# Patient Record
Sex: Female | Born: 1975 | Hispanic: No | Marital: Single | Smoking: Former smoker
Health system: Southern US, Community
[De-identification: ages and names within clinical notes are randomized; demographics above are authoritative.]

---

## 2015-10-09 ENCOUNTER — Emergency Department (HOSPITAL_BASED_OUTPATIENT_CLINIC_OR_DEPARTMENT_OTHER)
Admission: EM | Admit: 2015-10-09 | Discharge: 2015-10-09 | Disposition: A | Discharge status: Home / Self Care | Payer: BLUE CROSS/BLUE SHIELD | Attending: Emergency Medicine | Admitting: Emergency Medicine

## 2015-10-09 ENCOUNTER — Emergency Department (HOSPITAL_BASED_OUTPATIENT_CLINIC_OR_DEPARTMENT_OTHER): Payer: BLUE CROSS/BLUE SHIELD

## 2015-10-09 ENCOUNTER — Encounter (HOSPITAL_BASED_OUTPATIENT_CLINIC_OR_DEPARTMENT_OTHER): Payer: Self-pay | Admitting: Emergency Medicine

## 2015-10-09 DIAGNOSIS — R0789 Other chest pain: Secondary | ICD-10-CM | POA: Diagnosis not present

## 2015-10-09 DIAGNOSIS — Z87891 Personal history of nicotine dependence: Secondary | ICD-10-CM | POA: Insufficient documentation

## 2015-10-09 LAB — CBC WITH DIFFERENTIAL/PLATELET
BASOS PCT: 0 %
Basophils Absolute: 0 10*3/uL (ref 0.0–0.1)
EOS ABS: 0.3 10*3/uL (ref 0.0–0.7)
Eosinophils Relative: 4 %
HCT: 35.9 % — ABNORMAL LOW (ref 36.0–46.0)
HEMOGLOBIN: 12.1 g/dL (ref 12.0–15.0)
Lymphocytes Relative: 33 %
Lymphs Abs: 2.3 10*3/uL (ref 0.7–4.0)
MCH: 32.4 pg (ref 26.0–34.0)
MCHC: 33.7 g/dL (ref 30.0–36.0)
MCV: 96.2 fL (ref 78.0–100.0)
Monocytes Absolute: 0.6 10*3/uL (ref 0.1–1.0)
Monocytes Relative: 9 %
NEUTROS PCT: 54 %
Neutro Abs: 3.8 10*3/uL (ref 1.7–7.7)
Platelets: 190 10*3/uL (ref 150–400)
RBC: 3.73 MIL/uL — AB (ref 3.87–5.11)
RDW: 12.6 % (ref 11.5–15.5)
WBC: 7 10*3/uL (ref 4.0–10.5)

## 2015-10-09 LAB — TROPONIN I: Troponin I: <0.03 ng/mL (ref <0.03)

## 2015-10-09 LAB — BASIC METABOLIC PANEL
Anion gap: 7 (ref 5–15)
BUN: 18 mg/dL (ref 6–20)
CALCIUM: 9.9 mg/dL (ref 8.9–10.3)
CO2: 28 mmol/L (ref 22–32)
CREATININE: 0.67 mg/dL (ref 0.44–1.00)
Chloride: 103 mmol/L (ref 101–111)
GFR calc non Af Amer: >60 mL/min (ref >60)
Glucose, Bld: 104 mg/dL — ABNORMAL HIGH (ref 65–99)
Potassium: 4.2 mmol/L (ref 3.5–5.1)
SODIUM: 138 mmol/L (ref 135–145)

## 2015-10-09 MED ORDER — ACETAMINOPHEN 500 MG PO TABS
ORAL_TABLET | ORAL | Status: AC
  Filled 2015-10-09: qty 2

## 2015-10-09 MED ORDER — GI COCKTAIL ~~LOC~~
30.0000 mL | Freq: Once | ORAL | Status: AC
  Administered 2015-10-09: 30 mL via ORAL
  Filled 2015-10-09: qty 30

## 2015-10-09 MED ORDER — ACETAMINOPHEN 500 MG PO TABS
1000.0000 mg | ORAL_TABLET | Freq: Once | ORAL | Status: AC
  Administered 2015-10-09: 1000 mg via ORAL
  Filled 2015-10-09: qty 2

## 2015-10-09 MED ORDER — ACETAMINOPHEN 500 MG PO TABS
1000.0000 mg | ORAL_TABLET | Freq: Once | ORAL | Status: AC
  Administered 2015-10-09: 1000 mg via ORAL

## 2015-10-09 NOTE — ED Notes (Signed)
Pt in c/o L sided chest pain that radiates to L arm and neck that started last week. States previously happened and was told she has lung nodules. Pt with limited English but able to communicate basics, friend at bedside. Ambulatory in NAD.

## 2015-10-09 NOTE — ED Provider Notes (Addendum)
CSN: [id number]     Arrival date & time 10/09/15  1606 History   First MD Initiated Contact with Patient 10/09/15 1617     Chief Complaint  Patient presents with  . Chest Pain   Patient speaks minimally was. History is obtained using professional medical interpreter  (Consider location/radiation/quality/duration/timing/severity/associated sxs/prior Treatment) HPI [place] of left anterior chest pain radiating to left arm and neck onset 1 week ago intermittent lasting anywhere from from 10 minutes to one hour. Pain is improved with walking. No shortness of breath. She had similar pain earlier this year. Was seen at a hospital [place] was told that she had lung nodules and referred to a specialist. She is supposed to go for repeat CT scan of chest within the next few month. She denies any nausea or sweatiness. No treatment prior to coming here. Last normal menstrual period 2 weeks ago. No other associated symptoms. History reviewed. No pertinent past medical history. Past medical history lung nodules History reviewed. No pertinent past surgical history. History reviewed. No pertinent family history. Social History  Substance Use Topics  . Smoking status: Former Games developer  . Smokeless tobacco: None  . Alcohol Use: No  Ex smoker quit 3 months ago no alcohol no drugs OB History    No data available     Review of Systems  Constitutional: Negative.   HENT: Negative.   Respiratory: Negative.   Cardiovascular: Positive for chest pain.  Gastrointestinal: Negative.   Musculoskeletal: Negative.   Skin: Negative.   Neurological: Negative.   Psychiatric/Behavioral: Negative.   All other systems reviewed and are negative.     Allergies  Review of patient's allergies indicates not on file.  Home Medications   Prior to Admission medications   Not on File   BP 121/81 mmHg  Pulse 54  Temp(Src) 98 F (36.7 C)  Resp 18  SpO2 98% Physical Exam  Constitutional: She appears well-developed  and well-nourished.  HENT:  Head: Normocephalic and atraumatic.  Eyes: Conjunctivae are normal. Pupils are equal, round, and reactive to light.  Neck: Neck supple. No tracheal deviation present. No thyromegaly present.  Cardiovascular: Normal rate and regular rhythm.   No murmur heard. Pulmonary/Chest: Effort normal and breath sounds normal.  Abdominal: Soft. Bowel sounds are normal. She exhibits no distension. There is tenderness.  Of chest mildly tender. Pain is exacerbated when she sits up from a seated position  Musculoskeletal: Normal range of motion. She exhibits no edema or tenderness.  Neurological: She is alert. Coordination normal.  Skin: Skin is warm and dry. No rash noted.  Psychiatric: She has a normal mood and affect.  Nursing note and vitals reviewed.   ED Course  Procedures (including critical care time) Labs Review Labs Reviewed - No data to display  Imaging Review No results found. I have personally reviewed and evaluated these images and lab results as part of my medical decision-making.   EKG Interpretation   Date/Time:  Thursday October 09 2015 16:14:44 EDT Ventricular Rate:  53 PR Interval:    QRS Duration: 84 QT Interval:  421 QTC Calculation: 396 R Axis:   73 Text Interpretation:  Sinus rhythm No old tracing to compare Confirmed by  Breyton Vanscyoc  MD, Faye Strohman [phone]) on 10/09/2015 4:18:08 PM     Chest x-ray viewed by me Administered Tylenol 4:40 PM with transient relief. At 8:10 PM pain recurred after eating. She was given GI cocktail without relief. She states Tylenol helps better. Tylenol ordered again at 9:10 PM  Results for orders placed or performed during the hospital encounter of 10/09/15  Troponin I  Result Value Ref Range   Troponin I <0.03 <0.03 ng/mL  Troponin I  Result Value Ref Range   Troponin I <0.03 <0.03 ng/mL  Basic metabolic panel  Result Value Ref Range   Sodium 138 135 - 145 mmol/L   Potassium 4.2 3.5 - 5.1 mmol/L   Chloride 103  101 - 111 mmol/L   CO2 28 22 - 32 mmol/L   Glucose, Bld 104 (H) 65 - 99 mg/dL   BUN 18 6 - 20 mg/dL   Creatinine, Ser 0.450.67 0.44 - 1.00 mg/dL   Calcium 9.9 8.9 - 40.910.3 mg/dL   GFR calc non Af Amer >60 >60 mL/min   GFR calc Af Amer >60 >60 mL/min   Anion gap 7 5 - 15  CBC with Differential/Platelet  Result Value Ref Range   WBC 7.0 4.0 - 10.5 K/uL   RBC 3.73 (L) 3.87 - 5.11 MIL/uL   Hemoglobin 12.1 12.0 - 15.0 g/dL   HCT 81.135.9 (L) 91.436.0 - 78.246.0 %   MCV 96.2 78.0 - 100.0 fL   MCH 32.4 26.0 - 34.0 pg   MCHC 33.7 30.0 - 36.0 g/dL   RDW 95.612.6 21.311.5 - 08.615.5 %   Platelets 190 150 - 400 K/uL   Neutrophils Relative % 54 %   Neutro Abs 3.8 1.7 - 7.7 K/uL   Lymphocytes Relative 33 %   Lymphs Abs 2.3 0.7 - 4.0 K/uL   Monocytes Relative 9 %   Monocytes Absolute 0.6 0.1 - 1.0 K/uL   Eosinophils Relative 4 %   Eosinophils Absolute 0.3 0.0 - 0.7 K/uL   Basophils Relative 0 %   Basophils Absolute 0.0 0.0 - 0.1 K/uL   Dg Chest 2 View  10/09/2015  CLINICAL DATA:  Acute left side chest pain for 1 week EXAM: CHEST  2 VIEW COMPARISON:  None. FINDINGS: The heart size and mediastinal contours are within normal limits. Both lungs are clear. The visualized skeletal structures are unremarkable. IMPRESSION: No active cardiopulmonary disease. Electronically Signed   By: Natasha MeadLiviu  Pop M.D.   On: 10/09/2015 17:19    MDM  Patient is felt to be nonspecific. [facility] 04/21/2015 which showed tiny lung nodules largest being 4 mm if patient at low risk no follow-up needed if at high risk for bronchogenic carcinoma, repeat CT scan recommended for 1 year. Pain is felt to be nonspecific. Heart score equals 1. Plan Tylenol for pain. Referral [place] [facility] Diagnoses atypical chest pain Final diagnoses:  None        Doug SouSam Nnamdi Dacus, MD 10/09/15 2118  Doug SouSam Marcanthony Sleight, MD 10/09/15 2120

## 2015-10-09 NOTE — Discharge Instructions (Signed)
Nonspecific Chest Pain  Call [place] [facility] to get a primary care physician, or see your local community clinic. Take Tylenol as directed for pain. Chest pain can be caused by many different conditions. There is always a chance that your pain could be related to something serious, such as a heart attack or a blood clot in your lungs. Chest pain can also be caused by conditions that are not life-threatening. If you have chest pain, it is very important to follow up with your health care provider. CAUSES  Chest pain can be caused by:  Heartburn.  Pneumonia or bronchitis.  Anxiety or stress.  Inflammation around your heart (pericarditis) or lung (pleuritis or pleurisy).  A blood clot in your lung.  A collapsed lung (pneumothorax). It can develop suddenly on its own (spontaneous pneumothorax) or from trauma to the chest.  Shingles infection (varicella-zoster virus).  Heart attack.  Damage to the bones, muscles, and cartilage that make up your chest wall. This can include:  Bruised bones due to injury.  Strained muscles or cartilage due to frequent or repeated coughing or overwork.  Fracture to one or more ribs.  Sore cartilage due to inflammation (costochondritis). RISK FACTORS  Risk factors for chest pain may include:  Activities that increase your risk for trauma or injury to your chest.  Respiratory infections or conditions that cause frequent coughing.  Medical conditions or overeating that can cause heartburn.  Heart disease or family history of heart disease.  Conditions or health behaviors that increase your risk of developing a blood clot.  Having had chicken pox (varicella zoster). SIGNS AND SYMPTOMS Chest pain can feel like:  Burning or tingling on the surface of your chest or deep in your chest.  Crushing, pressure, aching, or squeezing pain.  Dull or sharp pain that is worse when you move, cough, or take a deep breath.  Pain  that is also felt in your back, neck, shoulder, or arm, or pain that spreads to any of these areas. Your chest pain may come and go, or it may stay constant. DIAGNOSIS Lab tests or other studies may be needed to find the cause of your pain. Your health care provider may have you take a test called an ambulatory ECG (electrocardiogram). An ECG records your heartbeat patterns at the time the test is performed. You may also have other tests, such as:  Transthoracic echocardiogram (TTE). During echocardiography, sound waves are used to create a picture of all of the heart structures and to look at how blood flows through your heart.  Transesophageal echocardiogram (TEE).This is a more advanced imaging test that obtains images from inside your body. It allows your health care provider to see your heart in finer detail.  Cardiac monitoring. This allows your health care provider to monitor your heart rate and rhythm in real time.  Holter monitor. This is a portable device that records your heartbeat and can help to diagnose abnormal heartbeats. It allows your health care provider to track your heart activity for several days, if needed.  Stress tests. These can be done through exercise or by taking medicine that makes your heart beat more quickly.  Blood tests.  Imaging tests. TREATMENT  Your treatment depends on what is causing your chest pain. Treatment may include:  Medicines. These may include:  Acid blockers for heartburn.  Anti-inflammatory medicine.  Pain medicine for inflammatory conditions.  Antibiotic medicine, if an infection is present.  Medicines to dissolve blood clots.  Medicines to treat coronary artery disease.  Supportive care for conditions that do not require medicines. This may include:  Resting.  Applying heat or cold packs to injured areas.  Limiting activities until pain decreases. HOME CARE INSTRUCTIONS  If you were prescribed an antibiotic medicine,  finish it all even if you start to feel better.  Avoid any activities that bring on chest pain.  Do not use any tobacco products, including cigarettes, chewing tobacco, or electronic cigarettes. If you need help quitting, ask your health care provider.  Do not drink alcohol.  Take medicines only as directed by your health care provider.  Keep all follow-up visits as directed by your health care provider. This is important. This includes any further testing if your chest pain does not go away.  If heartburn is the cause for your chest pain, you may be told to keep your head raised (elevated) while sleeping. This reduces the chance that acid will go from your stomach into your esophagus.  Make lifestyle changes as directed by your health care provider. These may include:  Getting regular exercise. Ask your health care provider to suggest some activities that are safe for you.  Eating a heart-healthy diet. A registered dietitian can help you to learn healthy eating options.  Maintaining a healthy weight.  Managing diabetes, if necessary.  Reducing stress. SEEK MEDICAL CARE IF:  Your chest pain does not go away after treatment.  You have a rash with blisters on your chest.  You have a fever. SEEK IMMEDIATE MEDICAL CARE IF:   Your chest pain is worse.  You have an increasing cough, or you cough up blood.  You have severe abdominal pain.  You have severe weakness.  You faint.  You have chills.  You have sudden, unexplained chest discomfort.  You have sudden, unexplained discomfort in your arms, back, neck, or jaw.  You have shortness of breath at any time.  You suddenly start to sweat, or your skin gets clammy.  You feel nauseous or you vomit.  You suddenly feel light-headed or dizzy.  Your heart begins to beat quickly, or it feels like it is skipping beats. These symptoms may represent a serious problem that is an emergency. Do not wait to see if the symptoms  will go away. Get medical help right away. Call your local emergency services (911 in the [place] Do not drive yourself to the hospital.   This information is not intended to replace advice given to you by your health care provider. Make sure you discuss any questions you have with your health care provider.   Document Released: 12/16/2004 Document Revised: 03/29/2014 Document Reviewed: 10/12/2013 Elsevier Interactive Patient Education [employer]

## 2015-10-09 NOTE — ED Notes (Signed)
Patient transported to X-ray 

## 2015-10-09 NOTE — ED Notes (Signed)
MD at bedside with interpreter services

## 2015-10-09 NOTE — ED Notes (Addendum)
MD at bedside. Using interpreter line to communicate.

## 2018-02-14 IMAGING — CR DG CHEST 2V
2 series · 2 of 2 positions shown · non-contrast
Comparison: None.

CLINICAL DATA: Acute left side chest pain for 1 week

EXAM:
CHEST  2 VIEW

[w chest pa]
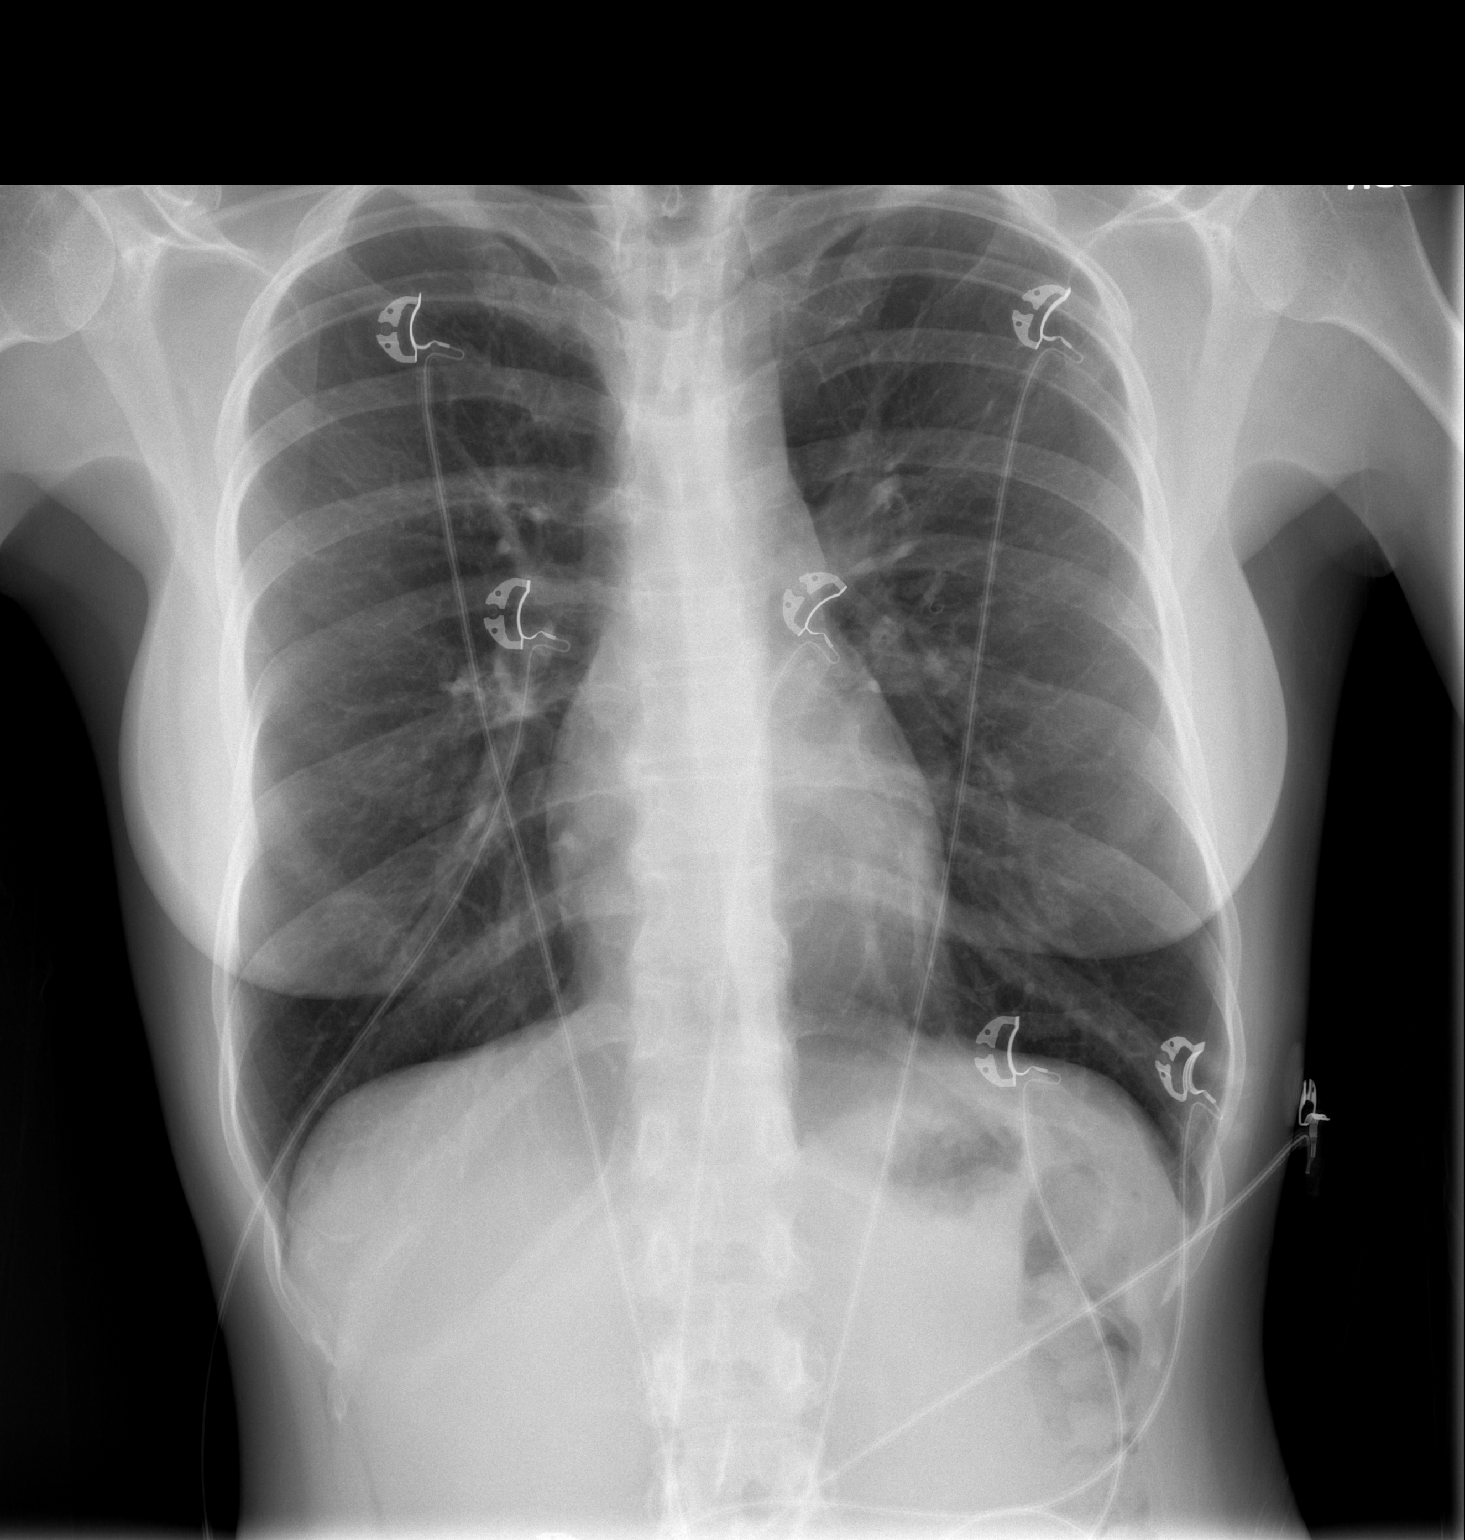

[w chest lat]
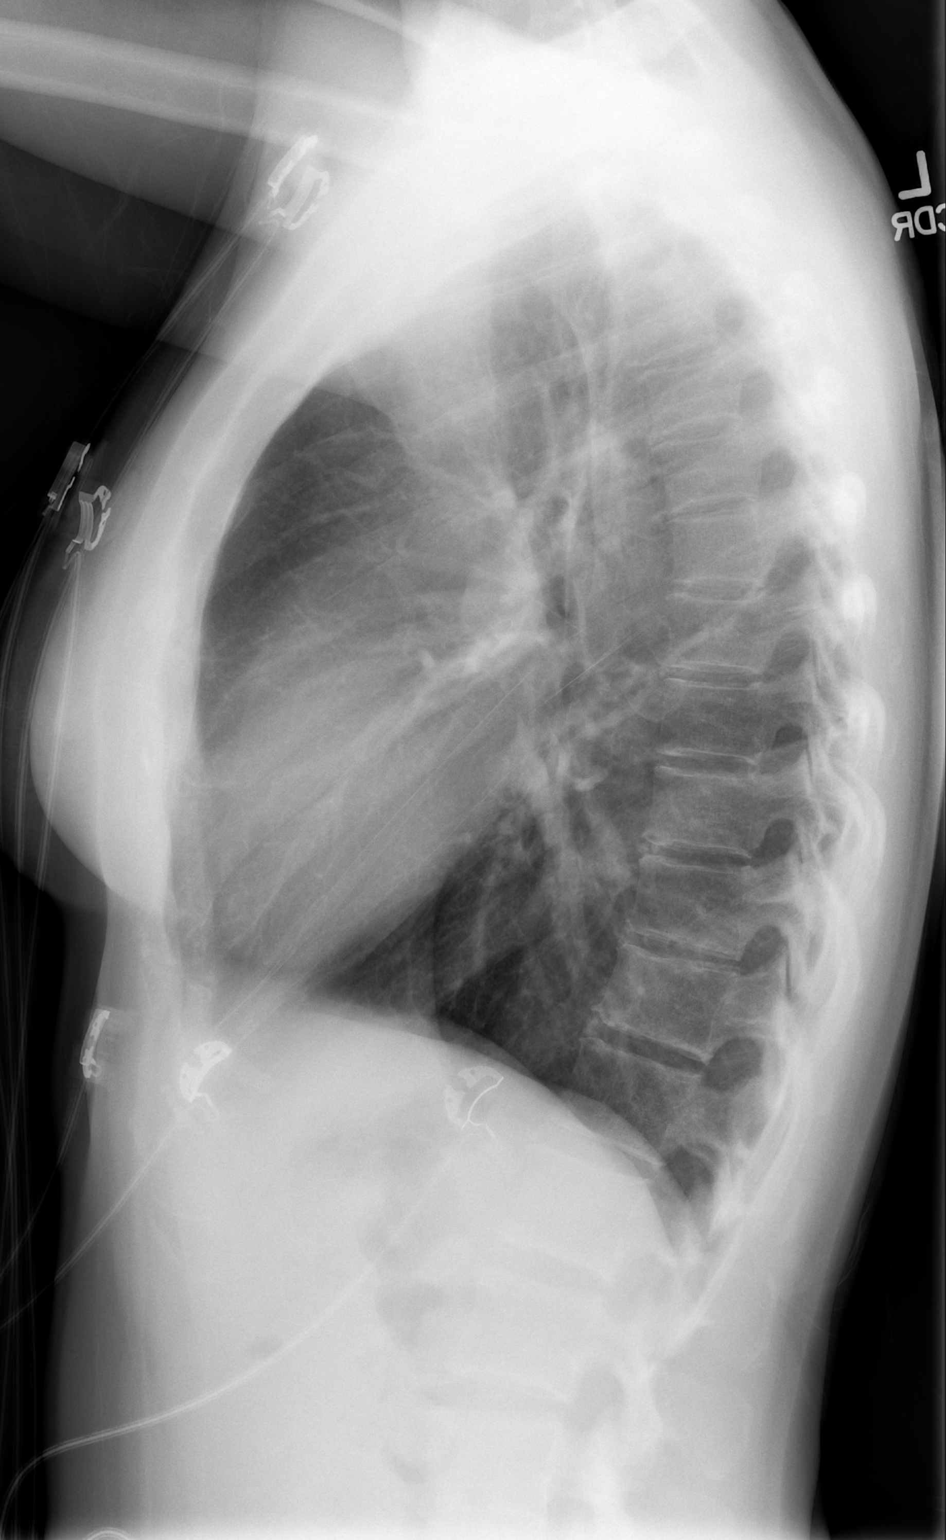

[2 of 2 positions shown; findings below may reference images not displayed]

FINDINGS: The heart size and mediastinal contours are within normal limits.
Both lungs are clear. The visualized skeletal structures are
unremarkable.
IMPRESSION: No active cardiopulmonary disease.
